# Patient Record
Sex: Female | Born: 1935 | Race: White | Hispanic: No | Marital: Single | Smoking: Never smoker
Health system: Southern US, Community
[De-identification: ages and names within clinical notes are randomized; demographics above are authoritative.]

## PROBLEM LIST (undated history)

## (undated) DIAGNOSIS — S72009A Fracture of unspecified part of neck of unspecified femur, initial encounter for closed fracture: Secondary | ICD-10-CM

## (undated) DIAGNOSIS — I119 Hypertensive heart disease without heart failure: Secondary | ICD-10-CM

## (undated) DIAGNOSIS — F419 Anxiety disorder, unspecified: Secondary | ICD-10-CM

## (undated) DIAGNOSIS — H8309 Labyrinthitis, unspecified ear: Secondary | ICD-10-CM

## (undated) DIAGNOSIS — E039 Hypothyroidism, unspecified: Secondary | ICD-10-CM

## (undated) DIAGNOSIS — R1013 Epigastric pain: Secondary | ICD-10-CM

## (undated) DIAGNOSIS — E785 Hyperlipidemia, unspecified: Secondary | ICD-10-CM

## (undated) HISTORY — DX: Hyperlipidemia, unspecified: E78.5

## (undated) HISTORY — DX: Anxiety disorder, unspecified: F41.9

## (undated) HISTORY — DX: Fracture of unspecified part of neck of unspecified femur, initial encounter for closed fracture: S72.009A

## (undated) HISTORY — DX: Labyrinthitis, unspecified ear: H83.09

## (undated) HISTORY — DX: Epigastric pain: R10.13

## (undated) HISTORY — DX: Hypertensive heart disease without heart failure: I11.9

## (undated) HISTORY — DX: Hypothyroidism, unspecified: E03.9

---

## 2000-01-20 ENCOUNTER — Ambulatory Visit (HOSPITAL_COMMUNITY): Admission: RE | Admit: 2000-01-20 | Discharge: 2000-01-20 | Payer: Self-pay | Admitting: Family Medicine

## 2000-01-20 ENCOUNTER — Encounter: Payer: Self-pay | Admitting: Family Medicine

## 2007-10-13 DIAGNOSIS — S72009A Fracture of unspecified part of neck of unspecified femur, initial encounter for closed fracture: Secondary | ICD-10-CM

## 2007-10-13 HISTORY — DX: Fracture of unspecified part of neck of unspecified femur, initial encounter for closed fracture: S72.009A

## 2010-06-14 ENCOUNTER — Encounter: Payer: Self-pay | Admitting: Family Medicine

## 2012-05-06 ENCOUNTER — Telehealth: Payer: Self-pay | Admitting: Family Medicine

## 2012-05-06 NOTE — Telephone Encounter (Signed)
She would like to see if Dwayne can come out and take blood and a urine sample from the patient. Would like a call back before 2pm if possible because she leaves.

## 2012-05-07 ENCOUNTER — Other Ambulatory Visit (INDEPENDENT_AMBULATORY_CARE_PROVIDER_SITE_OTHER): Payer: Medicare Other

## 2012-05-07 ENCOUNTER — Telehealth: Payer: Self-pay | Admitting: *Deleted

## 2012-05-07 ENCOUNTER — Other Ambulatory Visit: Payer: Self-pay | Admitting: Family Medicine

## 2012-05-07 DIAGNOSIS — N390 Urinary tract infection, site not specified: Secondary | ICD-10-CM

## 2012-05-07 DIAGNOSIS — R400 Somnolence: Secondary | ICD-10-CM

## 2012-05-07 DIAGNOSIS — R5383 Other fatigue: Secondary | ICD-10-CM

## 2012-05-07 DIAGNOSIS — R5381 Other malaise: Secondary | ICD-10-CM

## 2012-05-07 LAB — POCT URINALYSIS DIPSTICK
Bilirubin, UA: NEGATIVE
Blood, UA: NEGATIVE
Ketones, UA: NEGATIVE
Nitrite, UA: NEGATIVE
pH, UA: 6

## 2012-05-07 LAB — POCT UA - MICROSCOPIC ONLY: Epithelial cells, urine per micros: NEGATIVE

## 2012-05-07 LAB — BASIC METABOLIC PANEL
CO2: 30 mEq/L (ref 19–32)
Calcium: 9.2 mg/dL (ref 8.4–10.5)
Glucose, Bld: 101 mg/dL — ABNORMAL HIGH (ref 70–99)
Sodium: 141 mEq/L (ref 135–145)

## 2012-05-07 LAB — POCT CBC
HCT, POC: 32.7 % — AB (ref 37.7–47.9)
Hemoglobin: 10.9 g/dL — AB (ref 12.2–16.2)
MCH, POC: 29.7 pg (ref 27–31.2)
MCHC: 33.4 g/dL (ref 31.8–35.4)
POC Granulocyte: 4.5 (ref 2–6.9)

## 2012-05-07 NOTE — Telephone Encounter (Signed)
Caregiver called and stated pt is sleeping a lot. Per DWM , Dwayn will go to pt's home to draw labs. (CBC,BMP and UA)

## 2012-05-10 ENCOUNTER — Telehealth: Payer: Self-pay | Admitting: Family Medicine

## 2012-05-10 NOTE — Addendum Note (Signed)
Addended by: Bearl Mulberry on: 05/10/2012 02:46 PM   Modules accepted: Orders

## 2012-05-22 ENCOUNTER — Other Ambulatory Visit: Payer: Medicare Other

## 2012-05-22 DIAGNOSIS — N390 Urinary tract infection, site not specified: Secondary | ICD-10-CM

## 2012-05-22 DIAGNOSIS — R5381 Other malaise: Secondary | ICD-10-CM

## 2012-05-27 ENCOUNTER — Telehealth: Payer: Self-pay | Admitting: Family Medicine

## 2012-05-27 NOTE — Telephone Encounter (Signed)
Pt's caregiver notified of urine result Will call back if symptoms return

## 2012-06-19 NOTE — Telephone Encounter (Signed)
Was this taken care of?

## 2012-08-08 ENCOUNTER — Other Ambulatory Visit: Payer: Self-pay

## 2012-08-08 NOTE — Telephone Encounter (Signed)
LAST SEEN 10/23/11

## 2012-08-08 NOTE — Telephone Encounter (Signed)
I think because of her inactivity and increased risk of DVT with this medication that we should discontinue Evista completely. Please call the Diane Tran i.e. Mrs Clerance Lav and tell her the reason for doing this.

## 2012-08-09 ENCOUNTER — Telehealth: Payer: Self-pay | Admitting: Family Medicine

## 2012-08-09 NOTE — Telephone Encounter (Signed)
Elease Hashimoto pt's caregiver notified RX to be d/c'd [employer] understanding

## 2012-08-30 ENCOUNTER — Other Ambulatory Visit (INDEPENDENT_AMBULATORY_CARE_PROVIDER_SITE_OTHER): Payer: Medicare Other

## 2012-08-30 ENCOUNTER — Telehealth: Payer: Self-pay | Admitting: Family Medicine

## 2012-08-30 ENCOUNTER — Other Ambulatory Visit: Payer: Self-pay | Admitting: *Deleted

## 2012-08-30 DIAGNOSIS — N390 Urinary tract infection, site not specified: Secondary | ICD-10-CM

## 2012-08-30 DIAGNOSIS — I10 Essential (primary) hypertension: Secondary | ICD-10-CM

## 2012-08-30 DIAGNOSIS — R5381 Other malaise: Secondary | ICD-10-CM

## 2012-08-30 LAB — POCT UA - MICROSCOPIC ONLY
Bacteria, U Microscopic: NEGATIVE
Casts, Ur, LPF, POC: NEGATIVE
Crystals, Ur, HPF, POC: NEGATIVE
Yeast, UA: NEGATIVE

## 2012-08-30 LAB — POCT CBC
Hemoglobin: 11.3 g/dL — AB (ref 12.2–16.2)
Lymph, poc: 1.3 (ref 0.6–3.4)
MCH, POC: 31.3 pg — AB (ref 27–31.2)
MCHC: 35.4 g/dL (ref 31.8–35.4)
MPV: 9.3 fL (ref 0–99.8)
POC Granulocyte: 7.1 — AB (ref 2–6.9)
POC LYMPH PERCENT: 14.7 %L (ref 10–50)
Platelet Count, POC: 154 10*3/uL (ref 142–424)
RDW, POC: 13.3 %
WBC: 8.9 10*3/uL (ref 4.6–10.2)

## 2012-08-30 LAB — HEPATIC FUNCTION PANEL
Albumin: 3 g/dL — ABNORMAL LOW (ref 3.5–5.2)
Alkaline Phosphatase: 50 U/L (ref 39–117)
Total Bilirubin: 0.3 mg/dL (ref 0.3–1.2)
Total Protein: 5.8 g/dL — ABNORMAL LOW (ref 6.0–8.3)

## 2012-08-30 LAB — BASIC METABOLIC PANEL WITH GFR
BUN: 18 mg/dL (ref 6–23)
CO2: 26 mEq/L (ref 19–32)
Calcium: 9.2 mg/dL (ref 8.4–10.5)
Creat: 1.02 mg/dL (ref 0.50–1.10)

## 2012-08-30 LAB — POCT URINALYSIS DIPSTICK
Ketones, UA: NEGATIVE
Protein, UA: 100
pH, UA: 6.5

## 2012-08-30 NOTE — Telephone Encounter (Signed)
Going off of evista should have nothing to do with her gait. Staying on Evista could increase the risk of stroke, because she is so inactive, and that was why it was stopped

## 2012-08-30 NOTE — Progress Notes (Signed)
Patient came in for labs only.

## 2012-08-30 NOTE — Telephone Encounter (Signed)
dwm to address 

## 2012-09-02 ENCOUNTER — Other Ambulatory Visit (INDEPENDENT_AMBULATORY_CARE_PROVIDER_SITE_OTHER): Payer: Medicare Other

## 2012-09-02 DIAGNOSIS — N390 Urinary tract infection, site not specified: Secondary | ICD-10-CM

## 2012-09-02 LAB — POCT URINALYSIS DIPSTICK
Bilirubin, UA: NEGATIVE
Blood, UA: NEGATIVE
Ketones, UA: NEGATIVE
Protein, UA: NEGATIVE
Spec Grav, UA: 1.01
pH, UA: 7

## 2012-09-02 LAB — POCT UA - MICROSCOPIC ONLY
Bacteria, U Microscopic: NEGATIVE
Casts, Ur, LPF, POC: NEGATIVE
Crystals, Ur, HPF, POC: NEGATIVE
Mucus, UA: NEGATIVE
Yeast, UA: NEGATIVE

## 2012-09-02 NOTE — Telephone Encounter (Signed)
dwm still needs to address this med answer

## 2012-09-02 NOTE — Progress Notes (Signed)
Pt dropped off urine only 

## 2012-09-02 NOTE — Telephone Encounter (Signed)
I spoke with Mrs. Diane Tran wall this past weekend and told her that the lack of Evista is having nothing to do with her gait problems. In fact there is an increased risk of her having a blood clot and a stroke with taking the Evista because of her inactivity.

## 2012-09-03 ENCOUNTER — Telehealth: Payer: Self-pay | Admitting: *Deleted

## 2012-09-03 NOTE — Telephone Encounter (Signed)
Message copied by Magdalene River on Tue Sep 03, 2012 11:16 AM ------      Message from: Ernestina Penna      Created: Tue Sep 03, 2012  7:44 AM       Please call the caregiver, Elease Hashimoto      Make sure that patient takes Macrodantin 100 mg twice daily with food for at least 7 days, then recheck urinalysis ------

## 2012-09-04 ENCOUNTER — Other Ambulatory Visit: Payer: Self-pay | Admitting: *Deleted

## 2012-09-04 ENCOUNTER — Other Ambulatory Visit: Payer: Self-pay

## 2012-09-04 LAB — URINE CULTURE: Organism ID, Bacteria: NO GROWTH

## 2012-09-04 MED ORDER — MOMETASONE FUROATE 0.1 % EX CREA: TOPICAL_CREAM | Freq: Every day | CUTANEOUS | Status: DC

## 2012-09-04 MED ORDER — NITROFURANTOIN MACROCRYSTAL 100 MG PO CAPS: 100.0000 mg | ORAL_CAPSULE | Freq: Two times a day (BID) | ORAL | Status: DC

## 2012-09-04 NOTE — Telephone Encounter (Signed)
Last seen 10/23/11  DWM 

## 2012-09-05 LAB — URINE CULTURE: Colony Count: >=100000

## 2012-10-10 ENCOUNTER — Other Ambulatory Visit: Payer: Self-pay

## 2012-10-10 MED ORDER — MOMETASONE FUROATE 0.1 % EX CREA: TOPICAL_CREAM | Freq: Every day | CUTANEOUS | Status: DC

## 2012-10-10 NOTE — Telephone Encounter (Signed)
Last seen 10/23/11  DWM

## 2012-11-04 ENCOUNTER — Other Ambulatory Visit: Payer: Self-pay | Admitting: Family Medicine

## 2012-11-05 ENCOUNTER — Other Ambulatory Visit: Payer: Self-pay | Admitting: *Deleted

## 2012-11-05 MED ORDER — NITROFURANTOIN MACROCRYSTAL 50 MG PO CAPS: 50.0000 mg | ORAL_CAPSULE | Freq: Every day | ORAL | Status: DC

## 2012-12-02 ENCOUNTER — Other Ambulatory Visit: Payer: Self-pay | Admitting: Family Medicine

## 2012-12-03 NOTE — Telephone Encounter (Signed)
Last seen 10/23/11  DWM

## 2012-12-09 ENCOUNTER — Ambulatory Visit (INDEPENDENT_AMBULATORY_CARE_PROVIDER_SITE_OTHER): Payer: Medicare Other

## 2012-12-09 DIAGNOSIS — Z23 Encounter for immunization: Secondary | ICD-10-CM

## 2013-01-02 ENCOUNTER — Other Ambulatory Visit: Payer: Self-pay | Admitting: Family Medicine

## 2013-01-25 ENCOUNTER — Other Ambulatory Visit: Payer: Self-pay | Admitting: Family Medicine

## 2013-01-25 ENCOUNTER — Telehealth: Payer: Self-pay | Admitting: Family Medicine

## 2013-01-25 MED ORDER — AZITHROMYCIN 250 MG PO TABS: ORAL_TABLET | ORAL | Status: AC

## 2013-01-25 NOTE — Telephone Encounter (Signed)
Pt aware.

## 2013-01-25 NOTE — Telephone Encounter (Signed)
Called in zpak

## 2013-01-25 NOTE — Telephone Encounter (Signed)
Cough, congestion, started bad during the night Sitter had a cold now she is catching it [employer] No fever

## 2013-01-30 ENCOUNTER — Other Ambulatory Visit: Payer: Self-pay | Admitting: Family Medicine

## 2013-01-30 ENCOUNTER — Telehealth: Payer: Self-pay | Admitting: Family Medicine

## 2013-02-03 NOTE — Telephone Encounter (Signed)
Last seen 10/23/11  DWM   This med not on EPIC list

## 2013-02-03 NOTE — Telephone Encounter (Signed)
Please change his prescription to as needed refill

## 2013-02-03 NOTE — Telephone Encounter (Signed)
Please review

## 2013-02-03 NOTE — Telephone Encounter (Signed)
This medication can be refilled as needed

## 2013-02-03 NOTE — Telephone Encounter (Signed)
Done and family notified. ntbs if not improving.

## 2013-03-06 ENCOUNTER — Other Ambulatory Visit: Payer: Self-pay | Admitting: Nurse Practitioner

## 2013-03-06 ENCOUNTER — Other Ambulatory Visit: Payer: Self-pay | Admitting: Family Medicine

## 2013-03-10 NOTE — Telephone Encounter (Signed)
No visit in Epic. Please advise

## 2013-03-10 NOTE — Telephone Encounter (Signed)
No visit in Epic. Please advise 

## 2013-03-10 NOTE — Telephone Encounter (Signed)
ntbs- patient notified

## 2013-04-02 ENCOUNTER — Other Ambulatory Visit: Payer: Self-pay | Admitting: Family Medicine

## 2013-04-21 ENCOUNTER — Telehealth: Payer: Self-pay | Admitting: Family Medicine

## 2013-04-21 DIAGNOSIS — E559 Vitamin D deficiency, unspecified: Secondary | ICD-10-CM

## 2013-04-21 DIAGNOSIS — M25552 Pain in left hip: Secondary | ICD-10-CM

## 2013-04-21 DIAGNOSIS — E039 Hypothyroidism, unspecified: Secondary | ICD-10-CM

## 2013-04-21 DIAGNOSIS — K219 Gastro-esophageal reflux disease without esophagitis: Secondary | ICD-10-CM

## 2013-04-21 DIAGNOSIS — Z0000 Encounter for general adult medical examination without abnormal findings: Secondary | ICD-10-CM

## 2013-04-21 NOTE — Telephone Encounter (Signed)
Spoke with Elease Hashimotoatricia and per April in lab would be difficult now going to home for lab draw.  Elease Hashimotoatricia will bring her to office to see if they can go to the car without getting her out. . Also, she states pt fell and fx hip around 5 years ago and now she is leaning to that side. Would like area to be XR'd   Would like to talk to [place] B

## 2013-04-21 NOTE — Telephone Encounter (Signed)
They are aware to stop by for labs and xray

## 2013-04-22 ENCOUNTER — Other Ambulatory Visit: Payer: Medicare Other

## 2013-04-22 ENCOUNTER — Other Ambulatory Visit (INDEPENDENT_AMBULATORY_CARE_PROVIDER_SITE_OTHER): Payer: Medicare Other

## 2013-04-22 DIAGNOSIS — M25552 Pain in left hip: Secondary | ICD-10-CM

## 2013-04-22 DIAGNOSIS — K219 Gastro-esophageal reflux disease without esophagitis: Secondary | ICD-10-CM

## 2013-04-22 DIAGNOSIS — E559 Vitamin D deficiency, unspecified: Secondary | ICD-10-CM

## 2013-04-22 DIAGNOSIS — Z0000 Encounter for general adult medical examination without abnormal findings: Secondary | ICD-10-CM

## 2013-04-22 DIAGNOSIS — E039 Hypothyroidism, unspecified: Secondary | ICD-10-CM

## 2013-04-22 DIAGNOSIS — M25559 Pain in unspecified hip: Secondary | ICD-10-CM

## 2013-04-22 LAB — POCT CBC
Granulocyte percent: 60.8 %G (ref 37–80)
HCT, POC: 24.6 % — AB (ref 37.7–47.9)
Hemoglobin: 7.4 g/dL — AB (ref 12.2–16.2)
LYMPH, POC: 2 (ref 0.6–3.4)
MCH: 22.7 pg — AB (ref 27–31.2)
MCHC: 30.1 g/dL — AB (ref 31.8–35.4)
MCV: 75.4 fL — AB (ref 80–97)
MPV: 8.3 fL (ref 0–99.8)
PLATELET COUNT, POC: 315 10*3/uL (ref 142–424)
POC Granulocyte: 4.4 (ref 2–6.9)
POC LYMPH %: 28.3 % (ref 10–50)
RBC: 3.3 M/uL — AB (ref 4.04–5.48)
RDW, POC: 15 %
WBC: 7.2 10*3/uL (ref 4.6–10.2)

## 2013-04-22 NOTE — Progress Notes (Signed)
Pt came in for labs only 

## 2013-04-23 ENCOUNTER — Other Ambulatory Visit: Payer: Self-pay | Admitting: *Deleted

## 2013-04-23 ENCOUNTER — Other Ambulatory Visit (INDEPENDENT_AMBULATORY_CARE_PROVIDER_SITE_OTHER): Payer: Medicare Other

## 2013-04-23 DIAGNOSIS — R5381 Other malaise: Secondary | ICD-10-CM

## 2013-04-23 DIAGNOSIS — R710 Precipitous drop in hematocrit: Secondary | ICD-10-CM

## 2013-04-23 DIAGNOSIS — D649 Anemia, unspecified: Secondary | ICD-10-CM

## 2013-04-23 DIAGNOSIS — R5383 Other fatigue: Secondary | ICD-10-CM

## 2013-04-23 LAB — POCT URINALYSIS DIPSTICK
Bilirubin, UA: NEGATIVE
Glucose, UA: NEGATIVE
Ketones, UA: NEGATIVE
Nitrite, UA: NEGATIVE
PH UA: 6
Spec Grav, UA: 1.025
Urobilinogen, UA: NEGATIVE

## 2013-04-23 LAB — BMP8+EGFR
BUN/Creatinine Ratio: 22 (ref 11–26)
BUN: 23 mg/dL (ref 10–36)
CHLORIDE: 104 mmol/L (ref 97–108)
CO2: 22 mmol/L (ref 18–29)
CREATININE: 1.06 mg/dL — AB (ref 0.57–1.00)
Calcium: 9 mg/dL (ref 8.7–10.3)
GFR calc Af Amer: 50 mL/min/{1.73_m2} — ABNORMAL LOW (ref >59)
GFR calc non Af Amer: 43 mL/min/{1.73_m2} — ABNORMAL LOW (ref >59)
Glucose: 103 mg/dL — ABNORMAL HIGH (ref 65–99)
POTASSIUM: 4.5 mmol/L (ref 3.5–5.2)
SODIUM: 140 mmol/L (ref 134–144)

## 2013-04-23 LAB — VITAMIN D 25 HYDROXY (VIT D DEFICIENCY, FRACTURES): VIT D 25 HYDROXY: 68.8 ng/mL (ref 30.0–100.0)

## 2013-04-23 LAB — POCT UA - MICROSCOPIC ONLY
Casts, Ur, LPF, POC: NEGATIVE
Crystals, Ur, HPF, POC: NEGATIVE
Mucus, UA: NEGATIVE
Yeast, UA: NEGATIVE

## 2013-04-23 LAB — HEPATIC FUNCTION PANEL
ALT: 16 IU/L (ref 0–32)
AST: 19 IU/L (ref 0–40)
Albumin: 3.3 g/dL (ref 3.2–4.6)
Alkaline Phosphatase: 70 IU/L (ref 39–117)
Bilirubin, Direct: 0.07 mg/dL (ref 0.00–0.40)
Total Protein: 6.1 g/dL (ref 6.0–8.5)

## 2013-04-23 LAB — LIPID PANEL
CHOLESTEROL TOTAL: 158 mg/dL (ref 100–199)
Chol/HDL Ratio: 2.8 ratio units (ref 0.0–4.4)
HDL: 57 mg/dL (ref >39)
LDL Calculated: 81 mg/dL (ref 0–99)
Triglycerides: 102 mg/dL (ref 0–149)
VLDL Cholesterol Cal: 20 mg/dL (ref 5–40)

## 2013-04-23 LAB — POCT CBC
Granulocyte percent: 74.4 %G (ref 37–80)
HCT, POC: 25.1 % — AB (ref 37.7–47.9)
HEMOGLOBIN: 7.7 g/dL — AB (ref 12.2–16.2)
Lymph, poc: 1.4 (ref 0.6–3.4)
MCH, POC: 23.1 pg — AB (ref 27–31.2)
MCHC: 30.5 g/dL — AB (ref 31.8–35.4)
MCV: 75.7 fL — AB (ref 80–97)
MPV: 8 fL (ref 0–99.8)
POC Granulocyte: 6.8 (ref 2–6.9)
POC LYMPH PERCENT: 15.8 %L (ref 10–50)
Platelet Count, POC: 308 10*3/uL (ref 142–424)
RBC: 3.3 M/uL — AB (ref 4.04–5.48)
RDW, POC: 14.8 %
WBC: 9.1 10*3/uL (ref 4.6–10.2)

## 2013-04-23 NOTE — Addendum Note (Signed)
Addended by: Prescott GumLAND, Brinlyn Cena M on: 04/23/2013 12:14 PM   Modules accepted: Orders

## 2013-04-23 NOTE — Addendum Note (Signed)
Addended by: Orma RenderHODGES, Judieth Mckown F on: 04/23/2013 10:18 AM   Modules accepted: Orders

## 2013-04-23 NOTE — Progress Notes (Signed)
Patient came in for labs only.

## 2013-04-23 NOTE — Progress Notes (Signed)
Pt came in for labs only 

## 2013-04-24 ENCOUNTER — Telehealth: Payer: Self-pay | Admitting: Family Medicine

## 2013-04-24 LAB — ANEMIA PROFILE B
Basophils Absolute: 0 10*3/uL (ref 0.0–0.2)
Basos: 0 %
Eos: 2 %
Eosinophils Absolute: 0.2 10*3/uL (ref 0.0–0.4)
Ferritin: 11 ng/mL — ABNORMAL LOW (ref 15–150)
Folate: 12.9 ng/mL (ref >3.0)
HEMATOCRIT: 25.4 % — AB (ref 34.0–46.6)
Hemoglobin: 7.8 g/dL — CL (ref 11.1–15.9)
IRON SATURATION: 3 % — AB (ref 15–55)
Immature Grans (Abs): 0 10*3/uL (ref 0.0–0.1)
Immature Granulocytes: 0 %
Iron: 14 ug/dL — ABNORMAL LOW (ref 35–155)
Lymphocytes Absolute: 1.5 10*3/uL (ref 0.7–3.1)
Lymphs: 17 %
MCH: 24.2 pg — ABNORMAL LOW (ref 26.6–33.0)
MCHC: 30.7 g/dL — ABNORMAL LOW (ref 31.5–35.7)
MCV: 79 fL (ref 79–97)
MONOCYTES: 10 %
Monocytes Absolute: 0.9 10*3/uL (ref 0.1–0.9)
NEUTROS ABS: 6.5 10*3/uL (ref 1.4–7.0)
Neutrophils Relative %: 71 %
Platelets: 371 10*3/uL (ref 150–379)
RBC: 3.22 x10E6/uL — ABNORMAL LOW (ref 3.77–5.28)
RDW: 14.5 % (ref 12.3–15.4)
Retic Ct Pct: 1.4 % (ref 0.6–2.6)
TIBC: 438 ug/dL (ref 250–450)
UIBC: 424 ug/dL — ABNORMAL HIGH (ref 150–375)
Vitamin B-12: 479 pg/mL (ref 211–946)
WBC: 9.2 10*3/uL (ref 3.4–10.8)

## 2013-04-24 MED ORDER — CIPROFLOXACIN HCL 250 MG PO TABS: 250.0000 mg | ORAL_TABLET | Freq: Two times a day (BID) | ORAL | Status: DC

## 2013-04-24 NOTE — Telephone Encounter (Signed)
Change to Cipro 250 #14 one twice daily for infection until completed. Please remember that urine culture and sensitivity has not been returned.

## 2013-04-24 NOTE — Telephone Encounter (Signed)
Discussed with caregiver. She hold Macrodantin for now and give Cipro.  Asked her to notify us if symptoms do not improve or worsen. Culture is still pending.

## 2013-04-25 ENCOUNTER — Other Ambulatory Visit: Payer: Medicare Other

## 2013-04-25 DIAGNOSIS — Z1212 Encounter for screening for malignant neoplasm of rectum: Secondary | ICD-10-CM

## 2013-04-25 LAB — URINE CULTURE

## 2013-04-25 NOTE — Progress Notes (Signed)
Pt came in for labs only 

## 2013-04-26 LAB — FECAL OCCULT BLOOD, IMMUNOCHEMICAL: Fecal Occult Bld: NEGATIVE

## 2013-04-28 ENCOUNTER — Telehealth: Payer: Self-pay | Admitting: Family Medicine

## 2013-04-28 DIAGNOSIS — N390 Urinary tract infection, site not specified: Secondary | ICD-10-CM

## 2013-04-28 NOTE — Telephone Encounter (Signed)
She is not alert cant get her down here to draw her blood, she wants someone to come to her house to draw.

## 2013-04-28 NOTE — Telephone Encounter (Signed)
Another urine should be checked and there should be a clean catch midstream specimen if that is possible. The recent urine culture that was done did not grow out any specific bacteria that could be treated. Please confirm current treatment which I think is Cipro.

## 2013-04-28 NOTE — Telephone Encounter (Signed)
Caregiver will bring a clean catch urine specimen in the morning.  They don't feel that she is bouncing back as quickly as she normally does.   They are also concerned that she won't be able to come back to the office for repeat labwork on Wednesday. Is there anyone that can go out to her home?

## 2013-04-29 ENCOUNTER — Telehealth: Payer: Self-pay | Admitting: Family Medicine

## 2013-04-29 ENCOUNTER — Encounter (INDEPENDENT_AMBULATORY_CARE_PROVIDER_SITE_OTHER): Payer: Medicare Other

## 2013-04-29 DIAGNOSIS — N390 Urinary tract infection, site not specified: Secondary | ICD-10-CM

## 2013-04-29 LAB — POCT UA - MICROSCOPIC ONLY
Casts, Ur, LPF, POC: NEGATIVE
Crystals, Ur, HPF, POC: NEGATIVE
Mucus, UA: NEGATIVE
Yeast, UA: NEGATIVE

## 2013-04-29 LAB — POCT URINALYSIS DIPSTICK
Bilirubin, UA: NEGATIVE
Glucose, UA: NEGATIVE
KETONES UA: NEGATIVE
Nitrite, UA: POSITIVE
PH UA: 6.5
SPEC GRAV UA: 1.02
Urobilinogen, UA: NEGATIVE

## 2013-04-30 ENCOUNTER — Encounter: Payer: Self-pay | Admitting: Family Medicine

## 2013-04-30 ENCOUNTER — Ambulatory Visit (INDEPENDENT_AMBULATORY_CARE_PROVIDER_SITE_OTHER): Payer: Medicare Other | Admitting: Family Medicine

## 2013-04-30 VITALS — BP 140/69 | HR 89 | Temp 99.0°F

## 2013-04-30 DIAGNOSIS — R4182 Altered mental status, unspecified: Secondary | ICD-10-CM

## 2013-04-30 DIAGNOSIS — R400 Somnolence: Secondary | ICD-10-CM

## 2013-04-30 DIAGNOSIS — R404 Transient alteration of awareness: Secondary | ICD-10-CM

## 2013-04-30 DIAGNOSIS — N390 Urinary tract infection, site not specified: Secondary | ICD-10-CM

## 2013-04-30 DIAGNOSIS — E039 Hypothyroidism, unspecified: Secondary | ICD-10-CM | POA: Insufficient documentation

## 2013-04-30 DIAGNOSIS — M810 Age-related osteoporosis without current pathological fracture: Secondary | ICD-10-CM | POA: Insufficient documentation

## 2013-04-30 LAB — CBC WITH DIFFERENTIAL
Basophils Absolute: 0 10*3/uL (ref 0.0–0.2)
Basos: 0 %
EOS ABS: 0.1 10*3/uL (ref 0.0–0.4)
Eos: 0 %
HCT: 26 % — ABNORMAL LOW (ref 34.0–46.6)
Hemoglobin: 8.3 g/dL — CL (ref 11.1–15.9)
IMMATURE GRANULOCYTES: 0 %
Immature Grans (Abs): 0 10*3/uL (ref 0.0–0.1)
LYMPHS: 9 %
Lymphocytes Absolute: 1.1 10*3/uL (ref 0.7–3.1)
MCH: 24.5 pg — ABNORMAL LOW (ref 26.6–33.0)
MCHC: 31.9 g/dL (ref 31.5–35.7)
MCV: 77 fL — ABNORMAL LOW (ref 79–97)
MONOCYTES: 16 %
Monocytes Absolute: 2.2 10*3/uL — ABNORMAL HIGH (ref 0.1–0.9)
Neutrophils Absolute: 10.2 10*3/uL — ABNORMAL HIGH (ref 1.4–7.0)
Neutrophils Relative %: 75 %
PLATELETS: 292 10*3/uL (ref 150–379)
RBC: 3.39 x10E6/uL — ABNORMAL LOW (ref 3.77–5.28)
RDW: 16.5 % — ABNORMAL HIGH (ref 12.3–15.4)
WBC: 13.6 10*3/uL — AB (ref 3.4–10.8)

## 2013-04-30 LAB — POCT WET PREP (WET MOUNT)

## 2013-04-30 LAB — URINE CULTURE

## 2013-04-30 NOTE — Patient Instructions (Addendum)
Because of the patient's declining health status, we will arrange for home health to go in and monitor her with lab work and urinalysis We will also ask them to help with her gait training and stability Today, we will get a BMP, CBC, LFTs, and a cath urine for culture and sensitivity We will continue current medications at home until the results of these labs are returned  Use Monistat 3 day ovule over-the-counter. This is a suppository that is inserted vaginally for 3 days. There is also an external cream that can be applied to decrease irritation. Do not use powders if vaginal area.

## 2013-04-30 NOTE — Telephone Encounter (Signed)
They are going to bring patient to the back door. Lab will go out and draw blood and we will do a face to face visit so that home health services can be ordered.

## 2013-04-30 NOTE — Progress Notes (Signed)
Subjective:    Patient ID: Diane Tran, female    DOB: [birth year], 80 y.o.   MRN: [id number]  Altered Mental Status Associated symptoms include fatigue. Pertinent negatives include no fever.   Patient is brought in today by her caregivers. They are concerned about the decline in her alertness and mental status. She also has a UTI that has been resistant to treatment. She is nonambulatory and has been sleeping a lot during the day. They are able to rouse her to eat and her appetite seems unchanged.    Review of Systems  Constitutional: Positive for activity change and fatigue. Negative for fever and appetite change.  HENT: Negative.   Eyes: Negative.   Respiratory: Negative.   Cardiovascular: Negative.   Gastrointestinal: Negative.   Genitourinary:       "milky" color  Musculoskeletal: Negative.        Legs are somewhat contracted and will not fully extend at the knees  Skin: Negative.   Psychiatric/Behavioral: Positive for sleep disturbance (hypersomnolence) and decreased concentration.       Objective:   Physical Exam  Nursing note and vitals reviewed. Constitutional:  The patient is elderly and frail and somnolent at the time of the visit.  HENT:  Head: Normocephalic and atraumatic.  Eyes: Conjunctivae are normal. Right eye exhibits no discharge. Left eye exhibits no discharge. No scleral icterus.  She would not open her eyes to evaluate them during the visit   Neck: Normal range of motion. Neck supple.  Cardiovascular: Normal rate, regular rhythm and normal heart sounds.   No murmur heard. At 84 per minute  Pulmonary/Chest: Effort normal and breath sounds normal. No respiratory distress. She has no wheezes. She has no rales. She exhibits no tenderness.  Abdominal: Soft. Bowel sounds are normal. She exhibits no distension and no mass. There is no tenderness. There is no rebound and no guarding.  Specifically no suprapubic tenderness  Musculoskeletal: She exhibits no  edema and no tenderness.  Unable to assess her range of motion. According to the caregiver she was able to walk and move without problems this morning  Lymphadenopathy:    She has no cervical adenopathy.  Neurological: No cranial nerve deficit.  Patient was not cooperative and responsive to commands to get her attention during the visit.  Skin: Skin is warm and dry.  Psychiatric:  Unable to assess the psychological aspects of her care.   BP 140/69  Pulse 89  Temp(Src) 99 F (37.2 C) (Oral)        Assessment & Plan:  Home health for gait and strength training, follow up labs, urinary catheter for specimen collection.   1. UTI (urinary tract infection) - BMP8+EGFR - POCT urinalysis dipstick - POCT UA - Microscopic Only - CBC With differential/Platelet - POCT Wet Prep [facility])  2. Altered mental state - Hepatic function panel - Ambulatory referral to [place] - Face-to-face encounter (required for Medicare/Medicaid patients)  3. Osteoporosis - Ambulatory referral to [place] - Face-to-face encounter (required for Medicare/Medicaid patients)  4. Chronic UTI - CBC With differential/Platelet - POCT Wet Prep [facility])  5. Hypothyroid  6. Somnolence - CBC With differential/Platelet - POCT Wet Prep [facility]) - Ambulatory referral to [place] - Face-to-face encounter (required for Medicare/Medicaid patients)  Patient Instructions  Because of the patient's declining health status, we will arrange for home health to go in and monitor her with lab work  and urinalysis We will also ask them to help with her gait training and stability Today, we will get a BMP, CBC, LFTs, and a cath urine for culture and sensitivity We will continue current medications at home until the results of these labs are returned  Use Monistat 3 day ovule over-the-counter. This is a suppository that is inserted vaginally for 3 days. There is  also an external cream that can be applied to decrease irritation. Do not use powders if vaginal area.    Arrie Senate MD

## 2013-05-01 ENCOUNTER — Telehealth: Payer: Self-pay | Admitting: Family Medicine

## 2013-05-01 LAB — BMP8+EGFR
BUN/Creatinine Ratio: 22 (ref 11–26)
BUN: 26 mg/dL (ref 10–36)
CO2: 19 mmol/L (ref 18–29)
CREATININE: 1.18 mg/dL — AB (ref 0.57–1.00)
Calcium: 9.1 mg/dL (ref 8.7–10.3)
Chloride: 102 mmol/L (ref 97–108)
GFR, EST AFRICAN AMERICAN: 44 mL/min/{1.73_m2} — AB (ref >59)
GFR, EST NON AFRICAN AMERICAN: 38 mL/min/{1.73_m2} — AB (ref >59)
Glucose: 129 mg/dL — ABNORMAL HIGH (ref 65–99)
POTASSIUM: 4.6 mmol/L (ref 3.5–5.2)
SODIUM: 140 mmol/L (ref 134–144)

## 2013-05-01 LAB — HEPATIC FUNCTION PANEL
ALK PHOS: 67 IU/L (ref 39–117)
ALT: 32 IU/L (ref 0–32)
AST: 68 IU/L — ABNORMAL HIGH (ref 0–40)
Albumin: 3.4 g/dL (ref 3.2–4.6)
BILIRUBIN DIRECT: 0.11 mg/dL (ref 0.00–0.40)
BILIRUBIN TOTAL: 0.3 mg/dL (ref 0.0–1.2)
Total Protein: 6.4 g/dL (ref 6.0–8.5)

## 2013-05-01 NOTE — Telephone Encounter (Signed)
Patient caregiver aware

## 2013-05-01 NOTE — Telephone Encounter (Signed)
If no fever shouldn't need antibiotic

## 2013-05-01 NOTE — Telephone Encounter (Signed)
When sitter turns her from side to side she had green congestion and mucous coming out. Since she has her sitting up now she seems to be doing ok now

## 2013-05-02 ENCOUNTER — Encounter: Payer: Self-pay | Admitting: *Deleted

## 2013-05-02 ENCOUNTER — Telehealth: Payer: Self-pay | Admitting: Family Medicine

## 2013-05-02 NOTE — Progress Notes (Signed)
Dr Christell ConstantMoore called this AM. He wants orders for cath- urine asap and cbc and bmp - on Monday or tues. Gina aware and Home health aware.  Almira CoasterGina

## 2013-05-12 ENCOUNTER — Encounter: Payer: Self-pay | Admitting: *Deleted

## 2013-05-12 NOTE — Progress Notes (Signed)
Quick Note:  Copy of labs sent to patient ______ 

## 2013-05-27 ENCOUNTER — Telehealth: Payer: Self-pay | Admitting: Family Medicine

## 2013-05-27 MED ORDER — PROMETHAZINE HCL 12.5 MG PO TABS: 12.5000 mg | ORAL_TABLET | Freq: Two times a day (BID) | ORAL | Status: AC | PRN

## 2013-05-27 NOTE — Telephone Encounter (Signed)
Patient is taking Zantac BID and is not consuming caffeine. Script sent over electronically.  Caregiver made aware that this medication is extremely sedative and should only be used if absolutely necessary.  She stated understanding and they will use it with caution.

## 2013-05-27 NOTE — Telephone Encounter (Signed)
Make sure that she is taking her Zantac 150 twice daily Make sure that she is watching her caffeine intake You can call in Phenergan 12.5 mg one twice daily if needed for nausea #14 and no refills-----little reluctant to use this because of her age so this should be used only as needed

## 2013-06-02 ENCOUNTER — Other Ambulatory Visit: Payer: Self-pay | Admitting: Nurse Practitioner

## 2013-06-02 ENCOUNTER — Other Ambulatory Visit: Payer: Self-pay | Admitting: Family Medicine

## 2013-06-04 NOTE — Telephone Encounter (Signed)
No thyroid level in Epic.

## 2013-06-04 NOTE — Telephone Encounter (Signed)
Last refill 05/05/13. Last ov 04/30/13.

## 2013-06-12 DIAGNOSIS — G8190 Hemiplegia, unspecified affecting unspecified side: Secondary | ICD-10-CM

## 2013-06-12 DIAGNOSIS — Z452 Encounter for adjustment and management of vascular access device: Secondary | ICD-10-CM

## 2013-06-12 DIAGNOSIS — M810 Age-related osteoporosis without current pathological fracture: Secondary | ICD-10-CM

## 2013-06-12 DIAGNOSIS — R4182 Altered mental status, unspecified: Secondary | ICD-10-CM

## 2013-06-17 ENCOUNTER — Telehealth: Payer: Self-pay | Admitting: Family Medicine

## 2013-06-18 ENCOUNTER — Encounter: Payer: Self-pay | Admitting: Family Medicine

## 2013-06-18 ENCOUNTER — Ambulatory Visit: Payer: Medicare Other | Admitting: Family Medicine

## 2013-06-18 VITALS — BP 160/80 | HR 72

## 2013-06-18 DIAGNOSIS — I639 Cerebral infarction, unspecified: Secondary | ICD-10-CM | POA: Insufficient documentation

## 2013-06-18 DIAGNOSIS — L89309 Pressure ulcer of unspecified buttock, unspecified stage: Secondary | ICD-10-CM | POA: Insufficient documentation

## 2013-06-18 DIAGNOSIS — N390 Urinary tract infection, site not specified: Secondary | ICD-10-CM

## 2013-06-18 DIAGNOSIS — L8990 Pressure ulcer of unspecified site, unspecified stage: Secondary | ICD-10-CM

## 2013-06-18 DIAGNOSIS — I6350 Cerebral infarction due to unspecified occlusion or stenosis of unspecified cerebral artery: Secondary | ICD-10-CM

## 2013-06-18 DIAGNOSIS — F0390 Unspecified dementia without behavioral disturbance: Secondary | ICD-10-CM

## 2013-06-18 NOTE — Patient Instructions (Signed)
We will arrange Home Health and they will contact you about initial visit.

## 2013-06-18 NOTE — Telephone Encounter (Signed)
Dr Christell ConstantMoore is going to see patient at her home so that we can start Home Health Services.

## 2013-06-18 NOTE — Progress Notes (Signed)
Subjective:    Patient ID: Diane Tran, female    DOB: [birth year], 80 y.o.   MRN: [id number]  HPI Patient seen today in her home for visit to discuss home health and pressure wounds. Patient was lying in a single bed near window. She gave minimal responses, but was awake and did know who I (physican) was.      Patient Active Problem List   Diagnosis Date Noted  . CVA (cerebral infarction) 06/18/2013  . Dementia 06/18/2013  . Decubitus ulcer of buttock 06/18/2013  . Osteoporosis 04/30/2013  . Chronic UTI 04/30/2013  . Hypothyroid 04/30/2013   Outpatient Encounter Prescriptions as of 06/18/2013  Medication Sig  . levothyroxine (SYNTHROID, LEVOTHROID) 75 MCG tablet TAKE ONE TABLET BY MOUTH ONCE DAILY  . mometasone (ELOCON) 0.1 % cream APPLY TO THE AFFECTED AREA ONCE DAILY  . nitrofurantoin (MACRODANTIN) 50 MG capsule TAKE ONE CAPSULE ONCE DAILY  . potassium chloride (K-DUR,KLOR-CON) 10 MEQ tablet TAKE ONE TABLET BY MOUTH ONCE DAILY  . promethazine (PHENERGAN) 12.5 MG tablet Take 1 tablet (12.5 mg total) by mouth 2 (two) times daily as needed for nausea or vomiting.  . ranitidine (ZANTAC) 150 MG tablet Take 150 mg by mouth 2 (two) times daily.  . [DISCONTINUED] ciprofloxacin (CIPRO) 250 MG tablet Take 1 tablet (250 mg total) by mouth 2 (two) times daily.  Marland Kitchen aspirin (SB LOW DOSE ASA EC) 81 MG EC tablet Take 81 mg by mouth daily.    Marland Kitchen azithromycin (ZITHROMAX) 250 MG tablet Take 2 po first day and then one po qd x 4 days     Review of Systems  Constitutional: Negative.  Negative for appetite change.  HENT: Negative.   Eyes: Negative.   Respiratory: Negative.   Cardiovascular: Negative.   Gastrointestinal: Negative.   Endocrine: Negative.   Genitourinary: Negative.   Musculoskeletal: Negative.        Swelling of left hand  Skin: Negative.        Pressure sores  Allergic/Immunologic: Negative.   Neurological: Negative.   Hematological: Negative.   Psychiatric/Behavioral:  Negative.        Objective:   Physical Exam BP 160/80  Pulse 72  Patient was alert, but minimally responsive. She did recognize me. Skin- pale and patient is frail Contracted left arm secondary to to CVA- also decreased movement left side. 2 pressure sores on iliac crest left is worse than the right. Also some redness at sacral area.  Heart - RRR Lungs- clear anterior and posterior Abdomen soft 40 minutes of face-to-face time with the patient    Assessment & Plan:  Home health to be set up for wound care, labs and assistance in home with therapy and ROM. For now continue saline compresses to bed sores Current patient frequently as caregivers are doing Continue to make sure she gets plenty of fluids in Labs will include CBC, BMP, wound culture and sensitivity Home health will assist with pressure sores  Nyra Capes MD

## 2013-06-18 NOTE — Telephone Encounter (Signed)
Pressure sore on each side of buttocks close to waistline.  Areas were initially bruised looking but now are more red and one has a small opening. No current home health services. Pt would probably benefit from this but she would need a face-to-face visit for order. They can't bring her in.  Her caregivers rotate her every 2 hours. They have been cleaning the area and applying Elecon cream that was prescribed for a previous problem. They have also been applying cornstarch. Advised them to continue rotating her every 2 hours to avoid pressure. Keep areas clean and dry. They can continue with cream but I would leave off the cornstarch. I will forward this to Dr. Christell ConstantMoore to get his input as well.

## 2013-06-18 NOTE — Telephone Encounter (Signed)
A home visit was made to see the patient. She is receiving excellent care at home with 3 caregivers. She has a single bed with an air mattress and she is at the window. She is needing and they're getting her up to go to the bathroom etc. orders will be placed for lab work and home health to visit her specifically to take care of the pressure sores that she is developing on her low back

## 2013-06-19 ENCOUNTER — Telehealth: Payer: Self-pay | Admitting: Family Medicine

## 2013-06-19 NOTE — Telephone Encounter (Signed)
Health will do a wound culture and sensitivity. Once the results from back come back we will start an antibiotic for the wound. In the meantime continue with saline compresses and continue the Macrodantin until the culture and sensitivity comes back

## 2013-06-19 NOTE — Telephone Encounter (Signed)
Caregiver aware.

## 2013-06-20 ENCOUNTER — Telehealth: Payer: Self-pay

## 2013-06-20 MED ORDER — COLLAGENASE 250 UNIT/GM EX OINT: 1.0000 "application " | TOPICAL_OINTMENT | Freq: Every day | CUTANEOUS | Status: AC

## 2013-06-20 MED ORDER — DUODERM CGF DRESSING EX MISC: 1.0000 | Freq: Every day | CUTANEOUS | Status: AC

## 2013-06-20 NOTE — Telephone Encounter (Signed)
rx sent to pharmacy

## 2013-06-20 NOTE — Telephone Encounter (Signed)
Dr Christell ConstantMoore asked them to go see Ms. Decarli for 3 sores on her sacral area  One is hard to stage a lot of dead skin  Would like you to call in Santyl ointment to put on this and an RX for Duoderm.  Nurse will go out again tomorrow address this  Call into [facility] in [place]

## 2013-06-24 ENCOUNTER — Telehealth: Payer: Self-pay | Admitting: *Deleted

## 2013-06-24 NOTE — Telephone Encounter (Signed)
They would like a Hospice referral made. Dr. Christell ConstantMoore gave order, all demos and [facility]

## 2013-06-25 ENCOUNTER — Other Ambulatory Visit: Payer: Self-pay | Admitting: *Deleted

## 2013-06-25 DIAGNOSIS — Z515 Encounter for palliative care: Secondary | ICD-10-CM

## 2013-07-01 ENCOUNTER — Telehealth: Payer: Self-pay | Admitting: Family Medicine

## 2013-07-01 ENCOUNTER — Other Ambulatory Visit: Payer: Self-pay | Admitting: *Deleted

## 2013-07-01 DIAGNOSIS — Z515 Encounter for palliative care: Secondary | ICD-10-CM

## 2013-07-01 NOTE — Addendum Note (Signed)
Addended by: Ardine EngAYTON, JILL A on: 07/01/2013 02:34 PM   Modules accepted: Orders

## 2013-07-01 NOTE — Telephone Encounter (Signed)
Please discuss with hospice and find out if topical treatments are working and if there is a need for an antibiotic.

## 2013-07-01 NOTE — Telephone Encounter (Signed)
Hospice nurse Jaci Carrelose Marie does not think an antibiotic is needed for bed sore, she is appropriately dressing it. I notified private duty sitter.

## 2013-07-01 NOTE — Telephone Encounter (Signed)
Left message with Hospice nurse

## 2013-07-11 ENCOUNTER — Telehealth: Payer: Self-pay | Admitting: *Deleted

## 2013-07-11 ENCOUNTER — Other Ambulatory Visit: Payer: Self-pay | Admitting: Family Medicine

## 2013-07-11 MED ORDER — CEPHALEXIN 250 MG/5ML PO SUSR: 375.0000 mg | Freq: Four times a day (QID) | ORAL | Status: AC

## 2013-07-11 NOTE — Telephone Encounter (Signed)
Diane Tran at hospice aware and rx sent to pharmacy

## 2013-07-11 NOTE — Telephone Encounter (Signed)
[name] the hospice nurse wants to know if we can do another round of antibiotics for the pressure sore that she has. She states that it still has a little foul odor, but she states that it has improved.

## 2013-07-11 NOTE — Telephone Encounter (Signed)
Please call in a prescription for Keflex 250 suspension 200 cc 1 teaspoon 4 times daily for 10 day Hold the Macrodantin while she is taking this antibiotic When the Keflex suspension is completed she can restart the Macrodantin

## 2013-07-18 ENCOUNTER — Other Ambulatory Visit: Payer: Self-pay | Admitting: Family Medicine

## 2013-11-04 ENCOUNTER — Other Ambulatory Visit: Payer: Self-pay | Admitting: Family Medicine

## 2013-11-25 ENCOUNTER — Telehealth: Payer: Self-pay | Admitting: *Deleted

## 2013-11-25 NOTE — Telephone Encounter (Signed)
Handwritten rx for flu vaccine given- hospice to give flu shot

## 2013-12-11 ENCOUNTER — Other Ambulatory Visit: Payer: Self-pay | Admitting: Family Medicine

## 2013-12-15 ENCOUNTER — Telehealth: Payer: Self-pay | Admitting: Family Medicine

## 2013-12-15 NOTE — Telephone Encounter (Signed)
Originally rxd on 11-06-13. Please advise on refill

## 2014-01-12 ENCOUNTER — Other Ambulatory Visit: Payer: Self-pay | Admitting: Family Medicine

## 2014-01-12 DIAGNOSIS — R531 Weakness: Secondary | ICD-10-CM

## 2014-01-12 DIAGNOSIS — R1310 Dysphagia, unspecified: Secondary | ICD-10-CM

## 2014-01-12 DIAGNOSIS — I6789 Other cerebrovascular disease: Secondary | ICD-10-CM

## 2014-02-09 ENCOUNTER — Other Ambulatory Visit: Payer: Self-pay | Admitting: Family Medicine

## 2014-02-10 NOTE — Telephone Encounter (Signed)
The patient needs to have a BMP and a thyroid profile and a CBC. Please see if someone from our office can go to the home and do this or if home health or hospice will do this.

## 2014-02-10 NOTE — Telephone Encounter (Signed)
Last thyroid labs 02/2012, last BMP 04/2013

## 2014-03-09 ENCOUNTER — Other Ambulatory Visit: Payer: Self-pay | Admitting: Family Medicine

## 2014-03-10 NOTE — Telephone Encounter (Signed)
Last seen 06/18/13  DWM

## 2014-03-27 ENCOUNTER — Telehealth: Payer: Self-pay | Admitting: *Deleted

## 2014-03-27 ENCOUNTER — Other Ambulatory Visit: Payer: Self-pay | Admitting: Nurse Practitioner

## 2014-03-27 MED ORDER — CIPROFLOXACIN HCL 500 MG PO TABS: 500.0000 mg | ORAL_TABLET | Freq: Two times a day (BID) | ORAL | Status: AC

## 2014-03-27 MED ORDER — CIPROFLOXACIN HCL 500 MG PO TABS: 500.0000 mg | ORAL_TABLET | Freq: Two times a day (BID) | ORAL | Status: DC

## 2014-03-27 NOTE — Telephone Encounter (Signed)
[name] with Hospice called to alert Dr Christell ConstantMoore that Ms Diane Tran has a stage IV decubitus at the top of her sacrum that is infected. There is green drainage with surrounding redness and warmth.  Can an antibiotic be sent to [facility]?  Call [name] back with order so that she can record it in her chart.   Discussed with Gennette PacMary Margaret, FNP. Cipro sent to pharmacy.

## 2014-05-13 ENCOUNTER — Other Ambulatory Visit: Payer: Self-pay | Admitting: Family Medicine

## 2014-05-14 ENCOUNTER — Encounter: Admitting: Family Medicine

## 2014-05-14 NOTE — Telephone Encounter (Signed)
Last seen 06/18/13 DWM

## 2014-06-14 DEATH — deceased

## 2014-07-15 DEATH — deceased

## 2015-10-23 IMAGING — CR DG HIP (WITH OR WITHOUT PELVIS) 2-3V*L*
2 series · 2 of 2 positions shown · non-contrast
Comparison: DG HIP COMPLETE 2+V*L* dated 10/14/2007; DG HIP COMPLETE
2+V*L* dated 10/13/2007

CLINICAL DATA: Left hip pain.

EXAM:
LEFT HIP - COMPLETE 2+ VIEW

[view not recorded (1 of 2)]
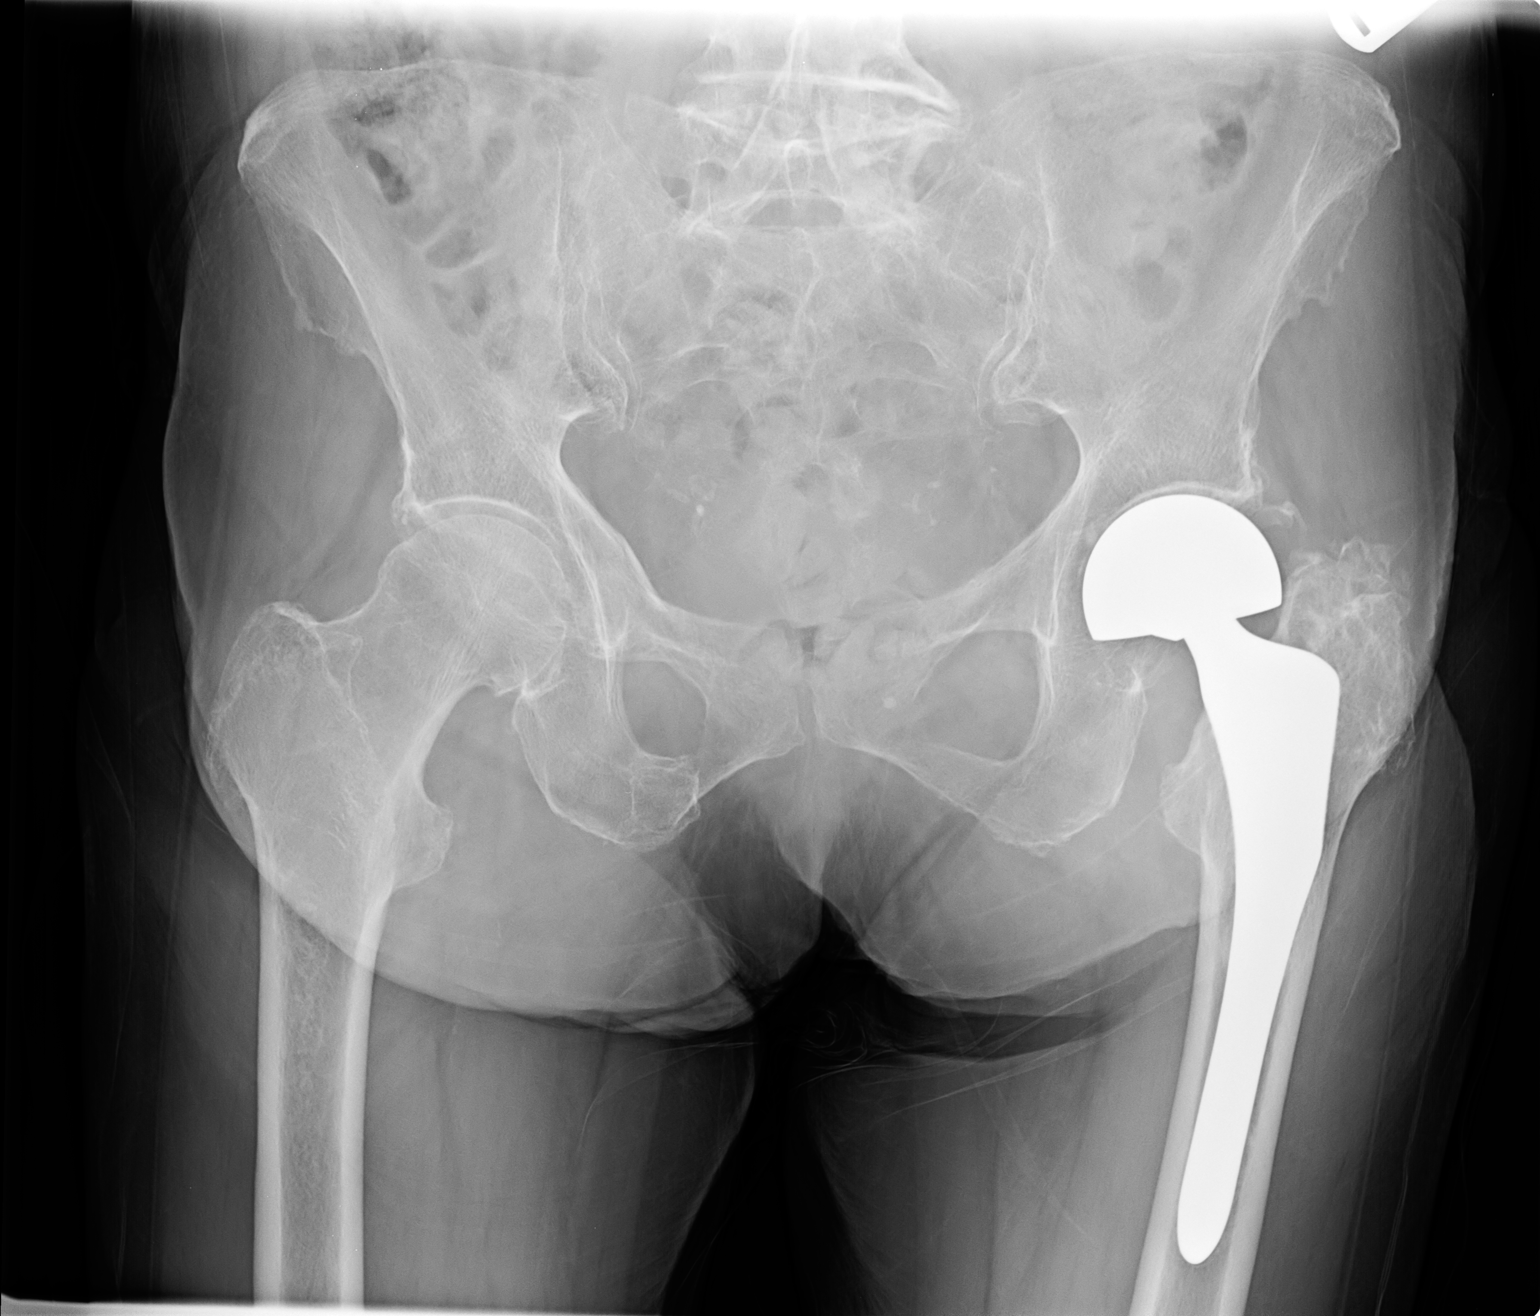

[view not recorded (2 of 2)]
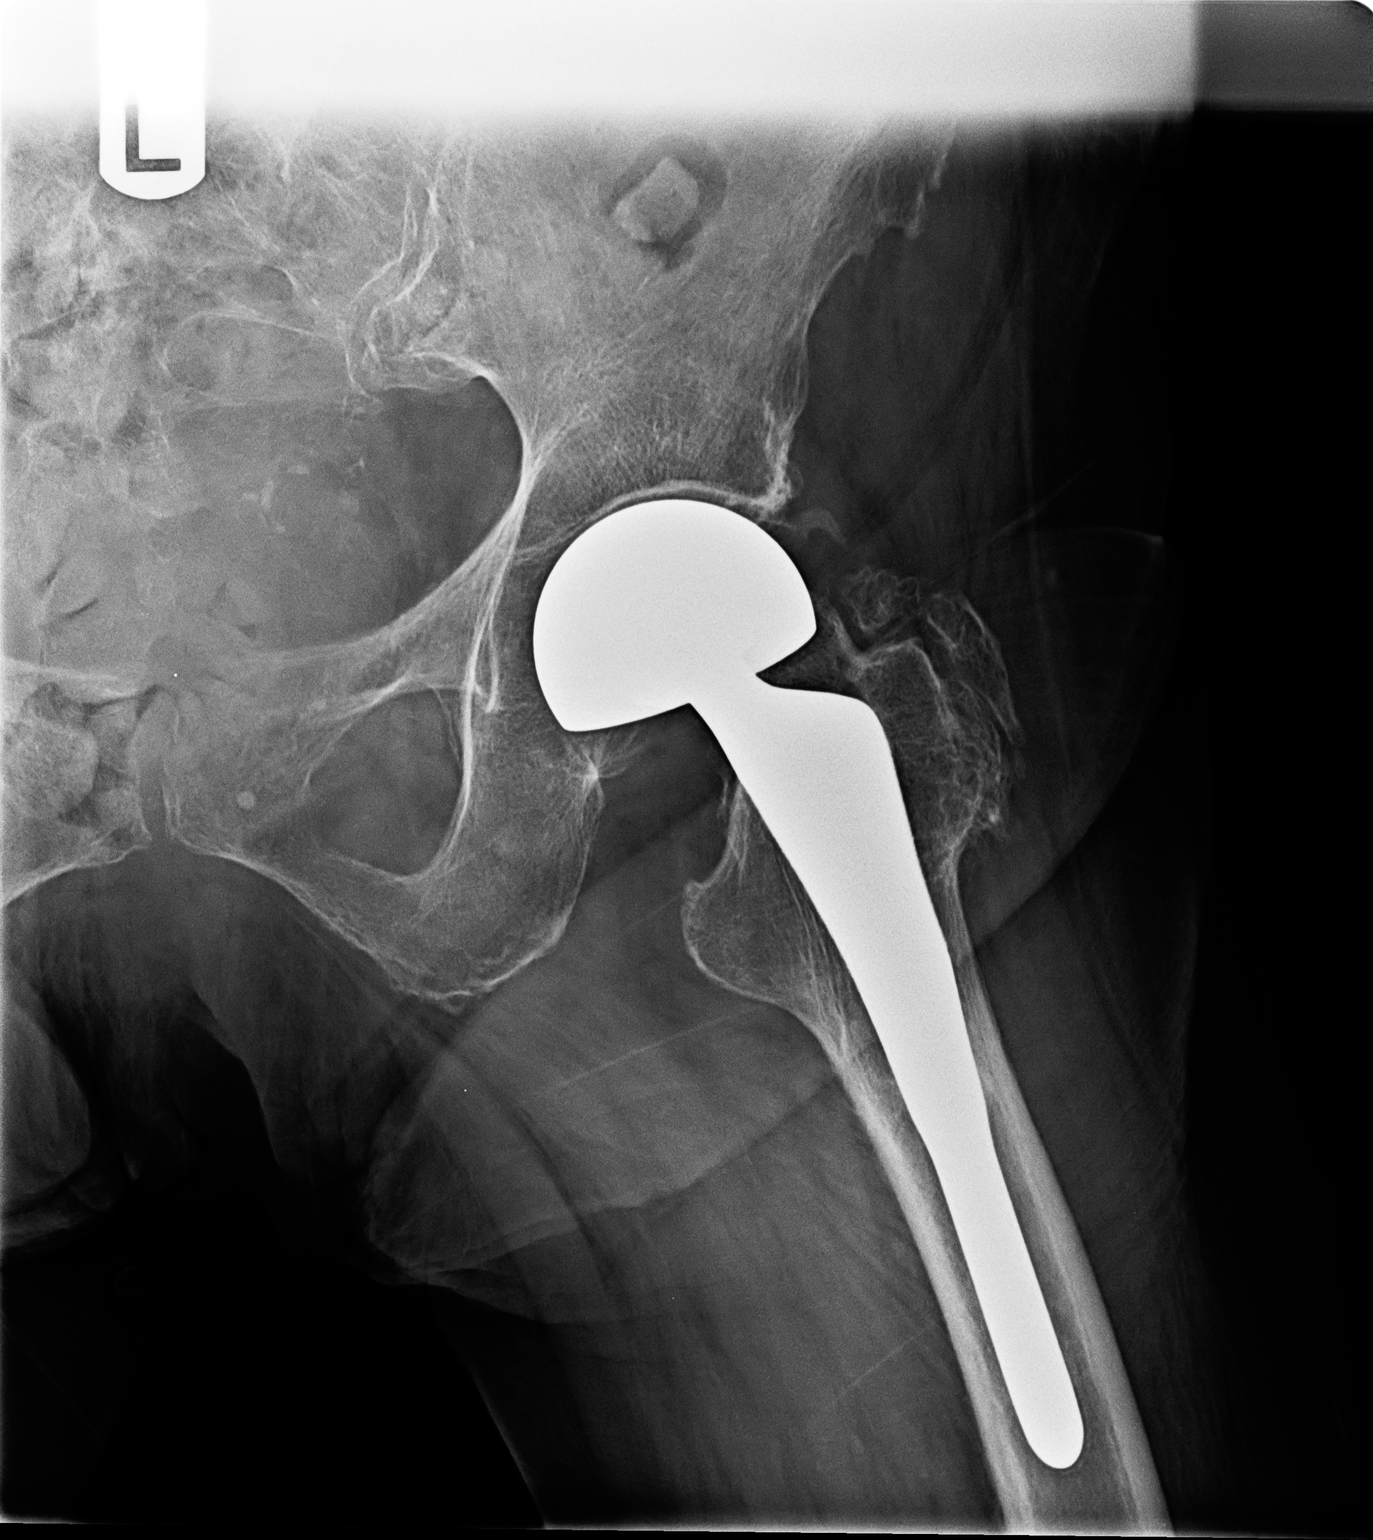

[2 of 2 positions shown; findings below may reference images not displayed]

FINDINGS: A left hip arthroplasty shows normal alignment and no evidence of
fracture, dislocation or abnormal lucency surrounding the bipolar
prosthesis. Proliferative changes are seen adjacent to the greater
trochanter and lateral acetabulum. The bony pelvis shows probable
old fracture involving the right superior pubic ramus at its
juncture with the ischium. Mild degenerative changes are present of
the right hip. No bony lesions are seen. Soft tissues are
unremarkable.
IMPRESSION: No evidence of left hip fracture. A bipolar hemiarthroplasty shows
normal alignment and no evidence of particle disease or abnormal
alignment.
